# Patient Record
Sex: Female | Born: 2000 | Race: Black or African American | Hispanic: No | Marital: Single
Health system: Southern US, Community
[De-identification: ages and names within clinical notes are randomized; demographics above are authoritative.]

---

## 2021-05-07 ENCOUNTER — Encounter (HOSPITAL_BASED_OUTPATIENT_CLINIC_OR_DEPARTMENT_OTHER): Payer: Self-pay

## 2021-05-07 ENCOUNTER — Emergency Department (HOSPITAL_BASED_OUTPATIENT_CLINIC_OR_DEPARTMENT_OTHER): Payer: 59 | Admitting: Radiology

## 2021-05-07 ENCOUNTER — Emergency Department (HOSPITAL_BASED_OUTPATIENT_CLINIC_OR_DEPARTMENT_OTHER)
Admission: EM | Admit: 2021-05-07 | Discharge: 2021-05-07 | Disposition: A | Discharge status: Home / Self Care | Payer: 59 | Attending: Emergency Medicine | Admitting: Emergency Medicine

## 2021-05-07 ENCOUNTER — Other Ambulatory Visit: Payer: Self-pay

## 2021-05-07 DIAGNOSIS — S1091XA Abrasion of unspecified part of neck, initial encounter: Secondary | ICD-10-CM | POA: Insufficient documentation

## 2021-05-07 DIAGNOSIS — Y92410 Unspecified street and highway as the place of occurrence of the external cause: Secondary | ICD-10-CM | POA: Insufficient documentation

## 2021-05-07 DIAGNOSIS — S199XXA Unspecified injury of neck, initial encounter: Secondary | ICD-10-CM | POA: Diagnosis present
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
